# Patient Record
Sex: Male | Born: 1975 | Race: White | Hispanic: No | Marital: Single | Smoking: Former smoker
Health system: Southern US, Community
[De-identification: ages and names within clinical notes are randomized; demographics above are authoritative.]

## PROBLEM LIST (undated history)

## (undated) DIAGNOSIS — I4891 Unspecified atrial fibrillation: Secondary | ICD-10-CM

## (undated) HISTORY — PX: HERNIA REPAIR: SHX51

---

## 2003-06-10 ENCOUNTER — Other Ambulatory Visit: Payer: Self-pay

## 2004-12-10 ENCOUNTER — Emergency Department: Payer: Self-pay | Admitting: Emergency Medicine

## 2004-12-21 ENCOUNTER — Emergency Department: Payer: Self-pay | Admitting: Emergency Medicine

## 2006-04-07 ENCOUNTER — Emergency Department: Payer: Self-pay | Admitting: Emergency Medicine

## 2006-04-10 ENCOUNTER — Emergency Department: Payer: Self-pay | Admitting: Emergency Medicine

## 2013-07-25 LAB — DRUG SCREEN, URINE
Amphetamines, Ur Screen: NEGATIVE (ref ?–1000)
BENZODIAZEPINE, UR SCRN: NEGATIVE (ref ?–200)
Barbiturates, Ur Screen: NEGATIVE (ref ?–200)
Cannabinoid 50 Ng, Ur ~~LOC~~: NEGATIVE (ref ?–50)
Cocaine Metabolite,Ur ~~LOC~~: NEGATIVE (ref ?–300)
MDMA (ECSTASY) UR SCREEN: NEGATIVE (ref ?–500)
Methadone, Ur Screen: NEGATIVE (ref ?–300)
OPIATE, UR SCREEN: NEGATIVE (ref ?–300)
Phencyclidine (PCP) Ur S: NEGATIVE (ref ?–25)
TRICYCLIC, UR SCREEN: NEGATIVE (ref ?–1000)

## 2013-07-25 LAB — CBC
HCT: 48.5 % (ref 40.0–52.0)
HGB: 16.5 g/dL (ref 13.0–18.0)
MCH: 30.5 pg (ref 26.0–34.0)
MCHC: 34 g/dL (ref 32.0–36.0)
MCV: 90 fL (ref 80–100)
PLATELETS: 243 10*3/uL (ref 150–440)
RBC: 5.4 10*6/uL (ref 4.40–5.90)
RDW: 12.5 % (ref 11.5–14.5)
WBC: 6.5 10*3/uL (ref 3.8–10.6)

## 2013-07-25 LAB — BASIC METABOLIC PANEL
Anion Gap: 8 (ref 7–16)
BUN: 14 mg/dL (ref 7–18)
CHLORIDE: 108 mmol/L — AB (ref 98–107)
CO2: 24 mmol/L (ref 21–32)
Calcium, Total: 8.9 mg/dL (ref 8.5–10.1)
Creatinine: 0.94 mg/dL (ref 0.60–1.30)
EGFR (African American): >60
EGFR (Non-African Amer.): >60
GLUCOSE: 108 mg/dL — AB (ref 65–99)
OSMOLALITY: 280 (ref 275–301)
Potassium: 3.3 mmol/L — ABNORMAL LOW (ref 3.5–5.1)
Sodium: 140 mmol/L (ref 136–145)

## 2013-07-25 LAB — TROPONIN I: Troponin-I: <0.02 ng/mL

## 2013-07-26 ENCOUNTER — Observation Stay: Payer: Self-pay | Admitting: Internal Medicine

## 2013-07-26 LAB — CK-MB
CK-MB: 1.6 ng/mL (ref 0.5–3.6)
CK-MB: 1.3 ng/mL (ref 0.5–3.6)
CK-MB: 1.7 ng/mL (ref 0.5–3.6)

## 2013-07-26 LAB — TSH: Thyroid Stimulating Horm: 7.87 u[IU]/mL — ABNORMAL HIGH

## 2013-07-26 LAB — TROPONIN I
Troponin-I: <0.02 ng/mL
Troponin-I: <0.02 ng/mL

## 2013-07-26 LAB — T4, FREE: Free Thyroxine: 1.09 ng/dL (ref 0.76–1.46)

## 2013-07-27 DIAGNOSIS — R000 Tachycardia, unspecified: Secondary | ICD-10-CM

## 2013-07-27 DIAGNOSIS — R079 Chest pain, unspecified: Secondary | ICD-10-CM

## 2013-07-27 LAB — LIPID PANEL
Cholesterol: 173 mg/dL (ref 0–200)
HDL Cholesterol: 44 mg/dL (ref 40–60)
LDL CHOLESTEROL, CALC: 110 mg/dL — AB (ref 0–100)
Triglycerides: 97 mg/dL (ref 0–200)
VLDL Cholesterol, Calc: 19 mg/dL (ref 5–40)

## 2013-07-27 LAB — POTASSIUM: Potassium: 3.8 mmol/L (ref 3.5–5.1)

## 2013-07-27 LAB — MAGNESIUM: Magnesium: 2.2 mg/dL

## 2014-06-01 NOTE — Discharge Summary (Signed)
PATIENT NAME:  Park LiterLOFTIS, Aarron A MR#:  [id number] DATE OF BIRTH:  Dec 02, 1975  DATE OF ADMISSION:  07/26/2013 DATE OF DISCHARGE:  07/27/2013  PRIMARY CARE PHYSICIAN:  Nonlocal.  DISCHARGE DIAGNOSIS:  New paroxysmal atrial fib, hyperlipidemia, hypokalemia.   CONDITION:  Stable.   CODE STATUS:   FULL CODE.   HOME MEDICATIONS:  Aspirin 81 mg p.o. daily.   DIET:  Low-fat.   ACTIVITY:  As tolerated.   FOLLOWUP CARE: Follow up with PCP within 2 to 4 weeks. Follow up with Dr. Lady GaryFath, cardiologist, p.r.n.  The patient was advised for exercise, diet control.   REASON FOR ADMISSION:  Chest fluttering.   HOSPITAL COURSE: The patient is a 39 year old Caucasian male with no past medical history, came to the ED for chest fluttering. The patient was noticed to have Afib with RVR up to 180s, was treated with Cardizem 20 mg IV. In the ED, the patient's heart rate was still in 120s, received more Cardizem and then converted to normal sinus rhythm. For detailed history and physical examination, please refer to the admission note dictated by Dr. Clint GuyHower. Laboratory data on admission date showed potassium 3.3, otherwise normal labs.  EKG performed on arrival reveals Afib with RVR at 122.  After Cardizem treatment, converted to normal sinus rhythm at 91 bpm.  1.  New onset paroxysmal atrial fib. The patient has no symptoms after converting to normal sinus rhythm. Dr. Lady GaryFath evaluated the patient with echocardiogram, which is normal. The patient's CHADS2 score is 0. According to Dr. Lady GaryFath, the patient does not need anticoagulation and also does not need further cardiac workup. The patient may be discharged.  2.  Hypokalemia, replaced with potassium and potassium increased to normal range. 3.  The patient's LDL is 110. He has hyperlipidemia. He was advised exercise and diet control.   The patient has no complaints. Vital signs are stable. The patient will be discharged to home today. I discussed the patient's discharge  plan with the patient, the patient's family member, nurse and case Production designer, theatre/television/filmmanager.   TIME SPENT: About 35 minutes.   ____________________________ Shaune PollackQing Gilbert Narain, MD qc:dmm D: 07/27/2013 16:41:00 ET T: 07/27/2013 22:34:23 ET JOB#: [id number]  cc: Shaune PollackQing Debe Anfinson, MD, <Dictator> Shaune PollackQING Keysean Savino MD ELECTRONICALLY SIGNED 07/28/2013 14:33

## 2014-06-01 NOTE — Consult Note (Signed)
   Present Illness 39 year old male with no prior cardiac or medical problems who presented with palpitations and was noted to be in atrial fibrillation rapid ventricular response.  He received IV Cardizem with conversion to sinus rhythm.  He was relatively hypo Kaylie me.  He currently is in normal sinus rhythm.  The he denies any chest pain or shortness of breath.  He ruled out for a myocardia infarction.  Patient admits to drinking a case of beer on Friday nights.  He does not drink much during the week.  The he denies previous episodes of sustained atrial fibrillation.  His chads score is 0. echocardiogram reveals preserved left ventricular function with no high-grade valvular disease.  Atria do not appear significantly enlarged.   Physical Exam:  GEN well developed, well nourished, no acute distress   HEENT PERRL, moist oral mucosa   NECK supple   RESP normal resp effort  clear BS   CARD Regular rate and rhythm  No murmur   ABD denies tenderness  normal BS   LYMPH negative neck, negative axillae   EXTR negative cyanosis/clubbing, negative edema   SKIN normal to palpation   NEURO cranial nerves intact, motor/sensory function intact   PSYCH A+O to time, place, person   Review of Systems:  Subjective/Chief Complaint Palpitations and rapid heart rate   General: No Complaints   Skin: No Complaints   ENT: No Complaints   Eyes: No Complaints   Neck: No Complaints   Respiratory: No Complaints   Cardiovascular: No Complaints   Gastrointestinal: No Complaints   Genitourinary: No Complaints   Vascular: No Complaints   Musculoskeletal: No Complaints   Neurologic: No Complaints   Hematologic: No Complaints   Endocrine: No Complaints   Psychiatric: No Complaints   Review of Systems: All other systems were reviewed and found to be negative   Medications/Allergies Reviewed Medications/Allergies reviewed   Family & Social History:  Family and Social History:   Family History Non-Contributory   Social History negative tobacco, positive ETOH   Place of Living Home   EKG:  Abnormal NSSTTW changes   Interpretation admission electrocardiogram revealed atrial fibrillation with variable ventricular response.  Currently is converted to sinus rhythm on telemetry    No Known Allergies:    Impression 39 year old male with history of binge ethanol intake to a with with atrial fibrillation with rapid ventricular response.  He is converted to sinus rhythm with Cardizem.  His chads score is 0.  His echo reveals preserved left ventricular function.  He does not appear to be in heart failure or ischemic at present.  He has ruled out for a myocardia infarction.  Would recommend ambulating and discharging on current regimen.  Patient does not need anticoagulation due to a chads score of 0. This is lone atrial fibrillation.  Cessation or reduction dramatically of ethanol intake was discussed with the patient.   Plan 1. Continue with current meds.  Patient does not need anticoagulation or anti arrhythmics at present as this appears to be lone atrial fibrillation likely secondary to relative   low serum  potassium and episodic ethanol intake. 2. Reduce ethanol intake 3. no further cardiac workup indicated.  Okay for discharge from cardiac standpoint   Electronic Signatures: Dalia HeadingFath, Kenneth A (MD)  (Signed 19-Jun-15 08:34)  Authored: General Aspect/Present Illness, History and Physical Exam, Review of System, Family & Social History, EKG , Allergies, Impression/Plan   Last Updated: 19-Jun-15 08:34 by Dalia HeadingFath, Kenneth A (MD)

## 2014-06-01 NOTE — H&P (Signed)
PATIENT NAME:  Todd Michael, Zyree A MR#:  [id number] DATE OF BIRTH:  07/08/1975  DATE OF ADMISSION:  07/25/2013  REFERRING PHYSICIAN: Dr. Bayard Malesandolph Brown.   PRIMARY CARE PHYSICIAN: None.   CHIEF COMPLAINT: Chest fluttering.   HISTORY OF PRESENT ILLNESS: A 39 year old Caucasian gentleman without significant past medical history presenting with palpitations. Describes acute onset of palpitations which he describes as fluttering sensation in his chest, occurring as he was going to sleep. Denies any chest pain, shortness of breath, nausea, vomiting, diaphoresis, fevers, chills or further symptomatology. Denies any preceding illnesses. He was in his usual state of health. On EMS arrival, he was noted to be in atrial fibrillation with rapid ventricular response, heart rate into the 180s. He was given Cardizem 20 mg IV. In the Emergency Department, he remained in atrial fibrillation, heart rate still in the 120s. Received more Cardizem and subsequently converted to normal sinus rhythm. Currently, he is asymptomatic without any complaints.   REVIEW OF SYSTEMS:  CONSTITUTIONAL: Denies fevers, chills, fatigue, weakness.  EYES: Denied blurred vision, double vision, eye pain.  EARS, NOSE, THROAT: Denies tinnitus, ear pain, hearing loss.  RESPIRATORY: Denies cough, wheeze, shortness of breath.  CARDIOVASCULAR: Positive for palpitations as described above. Denies any chest pain or edema.  GASTROINTESTINAL: Denies nausea, vomiting, diarrhea, abdominal pain.  GENITOURINARY: Denies dysuria or hematuria.  ENDOCRINE: Denies nocturia or thyroid problems.  HEMATOLOGIC AND LYMPHATIC: Denies easy bruising or bleeding.  SKIN: Denies rashes or lesions.  MUSCULOSKELETAL: Denies pain in neck, back, shoulder, knees, hips or arthritic symptoms.  NEUROLOGIC: Denies any paralysis, paresthesias.  PSYCHIATRIC: Denies anxiety or depressive symptoms.   Otherwise, full review of systems performed by me is negative.   PAST MEDICAL  HISTORY: None.   SOCIAL HISTORY: Denies any tobacco usage. Positive for occasional alcohol usage, stating drinks a few drinks weekly. Last drink greater than a week ago. Denies any drug use. As far as caffeinated beverages, drinks 4 to 5 cans of soda daily.   FAMILY HISTORY: Positive for coronary artery disease as well as atrial fibrillation in his parents as well as his sister.   ALLERGIES: No known drug allergies.   HOME MEDICATIONS: None.   PHYSICAL EXAMINATION:  VITAL SIGNS: Temperature 98.4, heart rate on arrival 142, currently 78, respirations 18, blood pressure 146/89, saturating 96% on room air. Weight 80.3 kg, BMI 26.2.  GENERAL: Well-nourished, well-developed, Caucasian gentleman, currently in no acute distress.  HEAD: Normocephalic, atraumatic.  EYES: Pupils equal, round and reactive to light. Extraocular muscles intact. No scleral icterus.  MOUTH: Moist mucosal membranes. Dentition intact. No abscess noted.  EARS, NOSE, THROAT: Clear without exudates. No external lesions.  NECK: Supple. No thyromegaly. No nodules. No JVD.  PULMONARY: Clear to auscultation bilaterally without wheezes, rubs or rhonchi. No use of accessory muscles. Good respiratory effort.  CHEST: Nontender to palpation.  CARDIOVASCULAR: S1, S2, regular rate and rhythm. No murmurs, rubs or gallops. No edema. Pedal pulses 2+ bilaterally.  GASTROINTESTINAL: Soft, nontender, nondistended. No masses. Positive bowel sounds. No hepatosplenomegaly.  MUSCULOSKELETAL: No swelling, clubbing or edema. Range of motion full in all extremities.  NEUROLOGIC: Cranial nerves II through XII intact. No gross focal neurological deficits. Sensation intact. Reflexes intact.  SKIN: No ulceration, lesions, rashes or cyanosis. Skin warm, dry. Turgor intact.  PSYCHIATRIC: Mood and affect within normal limits. The patient is awake, alert, oriented x 3. Insight and judgment intact.   LABORATORY DATA: EKG performed on arrival reveals atrial  fibrillation, rapid ventricular response, heart rate  of 122. Repeat EKG reveals normal sinus rhythm, heart rate 91. No ST or T wave abnormalities. Remainder of laboratory data: Sodium 140, potassium 3.3, chloride 108, bicarb 24, BUN 14, creatinine 0.94, glucose 108. Troponin I less than 0.02. Urine drug screen negative. WBC 6.5, hemoglobin 16.5, platelets of 243.   ASSESSMENT AND PLAN: A 39 year old gentleman without significant past medical history presenting with palpitations. Found to be in atrial fibrillation.  1. New onset atrial fibrillation with rapid ventricular response: He is currently in normal sinus rhythm after receiving a total of 40 mg of intravenous Cardizem. Will place on telemetry. Trend cardiac enzymes. Consult cardiology. Can use intravenous Cardizem if required for elevated heart rate sustained greater than 120. Will check a TSH and transthoracic echocardiogram in search for etiologies. His CHADS score is 0, meaning for anticoagulation he should just receive aspirin.  2. Hypokalemia: Replace potassium to goal of 4 to 5.  3. Venous thromboembolism prophylaxis with heparin subcutaneous.   The patient is FULL CODE.   TIME SPENT: 45 minutes.    ____________________________ Cletis Athens. Hower, MD dkh:gb D: 07/26/2013 01:23:19 ET T: 07/26/2013 01:43:27 ET JOB#: [id number]  cc: Cletis Athens. Hower, MD, <Dictator> DAVID Synetta Shadow MD ELECTRONICALLY SIGNED 07/26/2013 20:26

## 2014-06-01 NOTE — Consult Note (Signed)
Brief Consult Note: Diagnosis: afib with rvr.   Patient was seen by consultant.   Comments: 39 yo male with history who presented to the er with complaints of palpitaitons. He was noted to e in afib with rvr. He was given iv cardizem and converted to nsr with iv cardizem. He has ruled out for an mi. Echo showed normal lv funciton. I have discussed importance of reducing etoh intake. Would ambulate and consider discharge . Not candidate for anticoagulation with lone afib. CHADSS score is 0. Will follow as outpatient if desired. I will discontinue sq heparin. No further cardiac workup indicatd as inpatient.  Electronic Signatures: Dalia HeadingFath, Kenneth A (MD)  (Signed 18-Jun-15 18:12)  Authored: Brief Consult Note   Last Updated: 18-Jun-15 18:12 by Dalia HeadingFath, Kenneth A (MD)

## 2014-10-03 ENCOUNTER — Ambulatory Visit: Payer: Self-pay | Admitting: Family Medicine

## 2017-11-19 ENCOUNTER — Emergency Department
Admission: EM | Admit: 2017-11-19 | Discharge: 2017-11-19 | Disposition: A | Discharge status: Home / Self Care | Payer: Non-veteran care | Attending: Emergency Medicine | Admitting: Emergency Medicine

## 2017-11-19 ENCOUNTER — Emergency Department: Payer: Non-veteran care

## 2017-11-19 ENCOUNTER — Encounter: Payer: Self-pay | Admitting: Emergency Medicine

## 2017-11-19 ENCOUNTER — Other Ambulatory Visit: Payer: Self-pay

## 2017-11-19 DIAGNOSIS — K224 Dyskinesia of esophagus: Secondary | ICD-10-CM | POA: Insufficient documentation

## 2017-11-19 DIAGNOSIS — M62838 Other muscle spasm: Secondary | ICD-10-CM | POA: Diagnosis present

## 2017-11-19 DIAGNOSIS — Z87891 Personal history of nicotine dependence: Secondary | ICD-10-CM | POA: Diagnosis not present

## 2017-11-19 HISTORY — DX: Unspecified atrial fibrillation: I48.91

## 2017-11-19 LAB — COMPREHENSIVE METABOLIC PANEL
ALBUMIN: 4.5 g/dL (ref 3.5–5.0)
ALT: 34 U/L (ref 0–44)
AST: 24 U/L (ref 15–41)
Alkaline Phosphatase: 78 U/L (ref 38–126)
Anion gap: 10 (ref 5–15)
BILIRUBIN TOTAL: 0.6 mg/dL (ref 0.3–1.2)
BUN: 13 mg/dL (ref 6–20)
CALCIUM: 8.5 mg/dL — AB (ref 8.9–10.3)
CO2: 21 mmol/L — ABNORMAL LOW (ref 22–32)
CREATININE: 0.85 mg/dL (ref 0.61–1.24)
Chloride: 108 mmol/L (ref 98–111)
GFR calc Af Amer: >60 mL/min (ref >60)
GFR calc non Af Amer: >60 mL/min (ref >60)
GLUCOSE: 99 mg/dL (ref 70–99)
Potassium: 3.9 mmol/L (ref 3.5–5.1)
Sodium: 139 mmol/L (ref 135–145)
TOTAL PROTEIN: 7.1 g/dL (ref 6.5–8.1)

## 2017-11-19 LAB — CBC
HEMATOCRIT: 47.4 % (ref 39.0–52.0)
Hemoglobin: 16.1 g/dL (ref 13.0–17.0)
MCH: 30.1 pg (ref 26.0–34.0)
MCHC: 34 g/dL (ref 30.0–36.0)
MCV: 88.6 fL (ref 80.0–100.0)
Platelets: 272 10*3/uL (ref 150–400)
RBC: 5.35 MIL/uL (ref 4.22–5.81)
RDW: 11.9 % (ref 11.5–15.5)
WBC: 8.5 10*3/uL (ref 4.0–10.5)
nRBC: 0 % (ref 0.0–0.2)

## 2017-11-19 LAB — TROPONIN I
Troponin I: <0.03 ng/mL (ref <0.03)
Troponin I: <0.03 ng/mL (ref <0.03)

## 2017-11-19 MED ORDER — HYOSCYAMINE SULFATE 0.125 MG PO TABS: 0.1250 mg | ORAL_TABLET | ORAL | 0 refills | Status: AC | PRN

## 2017-11-19 MED ORDER — GI COCKTAIL ~~LOC~~
30.0000 mL | Freq: Once | ORAL | Status: AC
  Administered 2017-11-19: 30 mL via ORAL
  Filled 2017-11-19: qty 30

## 2017-11-19 NOTE — ED Triage Notes (Signed)
Chest pain began 11am today.

## 2017-11-19 NOTE — ED Provider Notes (Signed)
[facility] Emergency Department Provider Note   ____________________________________________    I have reviewed the triage vital signs and the nursing notes.   HISTORY  Chief Complaint Chest Pain     HPI Todd Michael is a 42 y.o. male who presents with complaints of chest spasms.  Patient reports that approximately 11 AM he developed pain in his chest which is described as a spasm-like pain.  He reports this started after eating homemade soup.  Patient does report a history of esophageal spasms for which he has been trialed on hyoscyamine which he reports worked quite well but that he no longer has.  He denies diaphoresis.  No shortness of breath.  No pleurisy.  No calf pain or swelling.  No fevers chills or cough.   Past Medical History:  Diagnosis Date  . Atrial fibrillation (HCC)     There are no active problems to display for this patient.   Past Surgical History:  Procedure Laterality Date  . HERNIA REPAIR      Prior to Admission medications   Medication Sig Start Date End Date Taking? Authorizing Provider  hyoscyamine (LEVSIN, ANASPAZ) 0.125 MG tablet Take 1 tablet (0.125 mg total) by mouth every 4 (four) hours as needed. 11/19/17   Jene Every, MD     Allergies Patient has no known allergies.  No family history on file.  Social History Social History   Tobacco Use  . Smoking status: Former Smoker  Substance Use Topics  . Alcohol use: Not on file  . Drug use: Not on file    Review of Systems  Constitutional: No fever/chills Eyes: No visual changes.  ENT: No sore throat. Cardiovascular: As above Respiratory: Denies shortness of breath. Gastrointestinal: No abdominal pain.   Genitourinary: Negative for dysuria. Musculoskeletal: Negative for back pain. Skin: Negative for rash. Neurological: Negative for headaches or weakness   ____________________________________________   PHYSICAL EXAM:  VITAL SIGNS: ED  Triage Vitals  Enc Vitals Group     BP 11/19/17 1505 (!) 146/66     Pulse Rate 11/19/17 1505 (!) 110     Resp 11/19/17 1505 20     Temp 11/19/17 1505 98.4 F (36.9 C)     Temp Source 11/19/17 1505 Oral     SpO2 11/19/17 1505 94 %     Weight 11/19/17 1506 87.5 kg (193 lb)     Height 11/19/17 1506 1.753 m (5\' 9" )     Head Circumference --      Peak Flow --      Pain Score 11/19/17 1505 1     Pain Loc --      Pain Edu? --      Excl. in GC? --     Constitutional: Alert and oriented.  Eyes: Conjunctivae are normal.   Nose: No congestion/rhinnorhea. Mouth/Throat: Mucous membranes are moist.    Cardiovascular: Normal rate, regular rhythm. Grossly normal heart sounds.  Good peripheral circulation. Respiratory: Normal respiratory effort.  No retractions. Lungs CTAB. Gastrointestinal: Soft and nontender. No distention.  No CVA tenderness. Genitourinary: deferred Musculoskeletal: No lower extremity tenderness nor edema.  Warm and well perfused Neurologic:  Normal speech and language. No gross focal neurologic deficits are appreciated.  Skin:  Skin is warm, dry and intact. No rash noted. Psychiatric: Mood and affect are normal. Speech and behavior are normal.  ____________________________________________   LABS (all labs ordered are listed, but only abnormal results are displayed)  Labs Reviewed  COMPREHENSIVE METABOLIC  PANEL - Abnormal; Notable for the following components:      Result Value   CO2 21 (*)    Calcium 8.5 (*)    All other components within normal limits  CBC  TROPONIN I  TROPONIN I   ____________________________________________  EKG  ED ECG REPORT I, Jene Every, the attending physician, personally viewed and interpreted this ECG.  Date: 11/19/2017  Rhythm: normal sinus rhythm QRS Axis: normal Intervals: normal ST/T Wave abnormalities: normal Narrative Interpretation: no evidence of acute  ischemia  ____________________________________________  RADIOLOGY  Chest x-ray unremarkable ____________________________________________   PROCEDURES  Procedure(s) performed: No  Procedures   Critical Care performed: No ____________________________________________   INITIAL IMPRESSION / ASSESSMENT AND PLAN / ED COURSE  Pertinent labs & imaging results that were available during my care of the patient were reviewed by me and considered in my medical decision making (see chart for details).  Patient overall well-appearing, he feels this is related to esophageal spasm and is very possible.  Troponin negative x2, EKG overall reassuring.  Chest x-ray normal.  Otherwise lab work is unremarkable.  Patient treated with GI cocktail and had instant resolution of symptoms furthering the hypothesis that this is related to esophageal spasm.  Given overall normal and reassuring work-up will discharge with hyoscyamine, close follow-up with PCP.  Strict return precautions discussed    ____________________________________________   FINAL CLINICAL IMPRESSION(S) / ED DIAGNOSES  Final diagnoses:  Esophageal spasm        Note:  This document was prepared using Dragon voice recognition software and may include unintentional dictation errors.    Jene Every, MD 11/19/17 2228

## 2019-07-01 ENCOUNTER — Other Ambulatory Visit: Payer: Self-pay

## 2019-07-01 ENCOUNTER — Observation Stay
Admission: EM | Admit: 2019-07-01 | Discharge: 2019-07-02 | Disposition: A | Discharge status: Home / Self Care | Payer: No Typology Code available for payment source | Attending: Internal Medicine | Admitting: Internal Medicine

## 2019-07-01 DIAGNOSIS — R778 Other specified abnormalities of plasma proteins: Secondary | ICD-10-CM

## 2019-07-01 DIAGNOSIS — R7989 Other specified abnormal findings of blood chemistry: Secondary | ICD-10-CM | POA: Insufficient documentation

## 2019-07-01 DIAGNOSIS — Z87891 Personal history of nicotine dependence: Secondary | ICD-10-CM | POA: Insufficient documentation

## 2019-07-01 DIAGNOSIS — I248 Other forms of acute ischemic heart disease: Secondary | ICD-10-CM

## 2019-07-01 DIAGNOSIS — E876 Hypokalemia: Secondary | ICD-10-CM | POA: Insufficient documentation

## 2019-07-01 DIAGNOSIS — Z20822 Contact with and (suspected) exposure to covid-19: Secondary | ICD-10-CM | POA: Insufficient documentation

## 2019-07-01 DIAGNOSIS — R000 Tachycardia, unspecified: Secondary | ICD-10-CM | POA: Diagnosis present

## 2019-07-01 DIAGNOSIS — I480 Paroxysmal atrial fibrillation: Principal | ICD-10-CM | POA: Insufficient documentation

## 2019-07-01 DIAGNOSIS — Z79899 Other long term (current) drug therapy: Secondary | ICD-10-CM | POA: Insufficient documentation

## 2019-07-01 DIAGNOSIS — F1010 Alcohol abuse, uncomplicated: Secondary | ICD-10-CM | POA: Insufficient documentation

## 2019-07-01 DIAGNOSIS — N289 Disorder of kidney and ureter, unspecified: Secondary | ICD-10-CM | POA: Insufficient documentation

## 2019-07-01 DIAGNOSIS — I4891 Unspecified atrial fibrillation: Secondary | ICD-10-CM

## 2019-07-01 LAB — CBC WITH DIFFERENTIAL/PLATELET
Abs Immature Granulocytes: 0.01 10*3/uL (ref 0.00–0.07)
Basophils Absolute: 0.1 10*3/uL (ref 0.0–0.1)
Basophils Relative: 1 %
Eosinophils Absolute: 0.5 10*3/uL (ref 0.0–0.5)
Eosinophils Relative: 8 %
HCT: 44.5 % (ref 39.0–52.0)
Hemoglobin: 15.7 g/dL (ref 13.0–17.0)
Immature Granulocytes: 0 %
Lymphocytes Relative: 27 %
Lymphs Abs: 1.6 10*3/uL (ref 0.7–4.0)
MCH: 30.9 pg (ref 26.0–34.0)
MCHC: 35.3 g/dL (ref 30.0–36.0)
MCV: 87.6 fL (ref 80.0–100.0)
Monocytes Absolute: 0.6 10*3/uL (ref 0.1–1.0)
Monocytes Relative: 10 %
Neutro Abs: 3.2 10*3/uL (ref 1.7–7.7)
Neutrophils Relative %: 54 %
Platelets: 238 10*3/uL (ref 150–400)
RBC: 5.08 MIL/uL (ref 4.22–5.81)
RDW: 12 % (ref 11.5–15.5)
WBC: 5.9 10*3/uL (ref 4.0–10.5)
nRBC: 0 % (ref 0.0–0.2)

## 2019-07-01 LAB — PROTIME-INR
INR: 1 (ref 0.8–1.2)
Prothrombin Time: 12.6 seconds (ref 11.4–15.2)

## 2019-07-01 MED ORDER — DILTIAZEM HCL 25 MG/5ML IV SOLN
20.0000 mg | Freq: Once | INTRAVENOUS | Status: AC
  Administered 2019-07-01: 20 mg via INTRAVENOUS
  Filled 2019-07-01: qty 5

## 2019-07-01 MED ORDER — SODIUM CHLORIDE 0.9 % IV BOLUS
1000.0000 mL | Freq: Once | INTRAVENOUS | Status: AC
  Administered 2019-07-01: 1000 mL via INTRAVENOUS

## 2019-07-01 MED ORDER — DILTIAZEM HCL ER COATED BEADS 180 MG PO CP24: 180.0000 mg | ORAL_CAPSULE | Freq: Once | ORAL | Status: DC

## 2019-07-01 MED ORDER — MAGNESIUM SULFATE 2 GM/50ML IV SOLN
2.0000 g | Freq: Once | INTRAVENOUS | Status: AC
  Administered 2019-07-01: 2 g via INTRAVENOUS
  Filled 2019-07-01: qty 50

## 2019-07-01 NOTE — ED Triage Notes (Signed)
Patient to the ED from home via EMS for a fib.  EMS and patient reports history of a fib that he will usually cough hard and returns to normal rhythm.  On EMS arrival heart rate 140's to 170's.  Given Metroprol 5mg  IV.  Patient with IV to left forearm via 20g angiocath.

## 2019-07-01 NOTE — ED Provider Notes (Signed)
[facility] Emergency Department Provider Note  ____________________________________________  Time seen: Approximately 11:35 PM  I have reviewed the triage vital signs and the nursing notes.   HISTORY  Chief Complaint Tachycardia    HPI Todd Michael is a 44 y.o. male with a past history of atrial fibrillation who comes the ED complaining of palpitations and racing heart, concerned that he is having A. fib again.  He states that he tends to binge drink on Fridays and Saturdays, last drinking was 2 days ago and was heavy.  Does not take any medications.  Onset of palpitations tonight was at bedtime, just 30 minutes prior to arrival, constant, no aggravating or alleviating factors.  He states that sometimes he gets palpitations and can cause it to go away with forceful coughing but not tonight.  Denies smoking or drug use.  No excessive caffeine or other stimulants.  Reviewed electronic medical record from 2015 when he had initial onset of atrial fibrillation, thought to be a lone episode due to alcohol abuse.      Past Medical History:  Diagnosis Date  . Atrial fibrillation ([place])      There are no problems to display for this patient.    Past Surgical History:  Procedure Laterality Date  . HERNIA REPAIR       Prior to Admission medications   Medication Sig Start Date End Date Taking? Authorizing Provider  hyoscyamine (LEVSIN, ANASPAZ) 0.125 MG tablet Take 1 tablet (0.125 mg total) by mouth every 4 (four) hours as needed. 11/19/17   Lavonia Drafts, MD     Allergies Patient has no known allergies.   No family history on file.  Social History Social History   Tobacco Use  . Smoking status: Former Smoker  Substance Use Topics  . Alcohol use: Not on file  . Drug use: Not on file    Review of Systems  Constitutional:   No fever or chills.  ENT:   No sore throat. No rhinorrhea. Cardiovascular:   No chest pain or syncope.   Positive palpitations Respiratory:   No dyspnea or cough. Gastrointestinal:   Negative for abdominal pain, vomiting and diarrhea.  Musculoskeletal:   Negative for focal pain or swelling All other systems reviewed and are negative except as documented above in ROS and HPI.  ____________________________________________   PHYSICAL EXAM:  VITAL SIGNS: ED Triage Vitals  Enc Vitals Group     BP 07/01/19 2218 (!) 156/119     Pulse Rate 07/01/19 2218 (!) 111     Resp 07/01/19 2218 19     Temp 07/01/19 2218 98.2 F (36.8 C)     Temp Source 07/01/19 2218 Oral     SpO2 07/01/19 2218 100 %     Weight 07/01/19 2231 185 lb (83.9 kg)     Height 07/01/19 2231 5\' 9"  (1.753 m)     Head Circumference --      Peak Flow --      Pain Score 07/01/19 2230 0     Pain Loc --      Pain Edu? --      Excl. in [place]? --     Vital signs reviewed, nursing assessments reviewed.   Constitutional:   Alert and oriented. Non-toxic appearance. Eyes:   Conjunctivae are normal. EOMI. PERRL. ENT      Head:   Normocephalic and atraumatic.      Nose:   Wearing a mask.      Mouth/Throat:  Wearing a mask.      Neck:   No meningismus. Full ROM. Hematological/Lymphatic/Immunilogical:   No cervical lymphadenopathy. Cardiovascular:   Irregularly irregular rhythm, tachycardia with a heart rate of 140. Symmetric bilateral radial and DP pulses.  No murmurs. Cap refill less than 2 seconds. Respiratory:   Normal respiratory effort without tachypnea/retractions. Breath sounds are clear and equal bilaterally. No wheezes/rales/rhonchi. Gastrointestinal:   Soft and nontender. Non distended. There is no CVA tenderness.  No rebound, rigidity, or guarding.  Musculoskeletal:   Normal range of motion in all extremities. No joint effusions.  No lower extremity tenderness.  No edema. Neurologic:   Normal speech and language.  Motor grossly intact. No acute focal neurologic deficits are appreciated.  Skin:    Skin is warm, dry and  intact. No rash noted.  No petechiae, purpura, or bullae.  ____________________________________________    LABS (pertinent positives/negatives) (all labs ordered are listed, but only abnormal results are displayed) Labs Reviewed  SARS CORONAVIRUS 2 BY RT PCR (HOSPITAL ORDER, PERFORMED IN [place])  CBC WITH DIFFERENTIAL/PLATELET  PROTIME-INR  COMPREHENSIVE METABOLIC PANEL  TSH  T4, FREE  MAGNESIUM  ETHANOL  TROPONIN I (HIGH SENSITIVITY)   ____________________________________________   EKG  Interpreted by me Atrial fibrillation, rate of 144.  Normal axis and intervals.  Normal QRS ST segments and T waves.  ____________________________________________    RADIOLOGY  No results found.  ____________________________________________   PROCEDURES .Critical Care Performed by: Sharman Cheek, MD Authorized by: Sharman Cheek, MD   Critical care provider statement:    Critical care time (minutes):  32   Critical care time was exclusive of:  Separately billable procedures and treating other patients   Critical care was necessary to treat or prevent imminent or life-threatening deterioration of the following conditions:  Circulatory failure and cardiac failure   Critical care was time spent personally by me on the following activities:  Development of treatment plan with patient or surrogate, discussions with consultants, evaluation of patient's response to treatment, examination of patient, obtaining history from patient or surrogate, ordering and performing treatments and interventions, ordering and review of laboratory studies, ordering and review of radiographic studies, pulse oximetry, re-evaluation of patient's condition and review of old charts    ____________________________________________  DIFFERENTIAL DIAGNOSIS   Cardiomyopathy, electrolyte abnormality, hyperthyroidism, dehydration, holiday heart  CLINICAL IMPRESSION / ASSESSMENT AND PLAN /  ED COURSE  Medications ordered in the ED: Medications  sodium chloride 0.9 % bolus 1,000 mL (1,000 mLs Intravenous New Bag/Given 07/01/19 2252)  diltiazem (CARDIZEM) injection 20 mg (20 mg Intravenous Given 07/01/19 2252)    Pertinent labs & imaging results that were available during my care of the patient were reviewed by me and considered in my medical decision making (see chart for details).  Tyrel A Figgs was evaluated in Emergency Department on 07/01/2019 for the symptoms described in the history of present illness. He was evaluated in the context of the global COVID-19 pandemic, which necessitated consideration that the patient might be at risk for infection with the SARS-CoV-2 virus that causes COVID-19. Institutional protocols and algorithms that pertain to the evaluation of patients at risk for COVID-19 are in a state of rapid change based on information released by regulatory bodies including the CDC and federal and state organizations. These policies and algorithms were followed during the patient's care in the ED.   Patient presents with essentially new onset of atrial fibrillation with RVR.  Will hydrate with 1 L saline  bolus, give diltiazem bolus.  If lab work-up is reassuring and he is able to convert back to sinus rhythm, could be discharged home.  However, if there are worrisome findings on labs or he remains in A. fib, he will likely require diltiazem infusion and hospitalization for cardiology evaluation.      ____________________________________________   FINAL CLINICAL IMPRESSION(S) / ED DIAGNOSES    Final diagnoses:  Atrial fibrillation with RVR [facility])     ED Discharge Orders    None      Portions of this note were generated with dragon dictation software. Dictation errors may occur despite best attempts at proofreading.   Sharman Cheek, MD 07/01/19 2342

## 2019-07-02 DIAGNOSIS — I4891 Unspecified atrial fibrillation: Secondary | ICD-10-CM | POA: Diagnosis not present

## 2019-07-02 DIAGNOSIS — F1010 Alcohol abuse, uncomplicated: Secondary | ICD-10-CM

## 2019-07-02 DIAGNOSIS — R778 Other specified abnormalities of plasma proteins: Secondary | ICD-10-CM

## 2019-07-02 LAB — COMPREHENSIVE METABOLIC PANEL
ALT: 22 U/L (ref 0–44)
AST: 20 U/L (ref 15–41)
Albumin: 4.1 g/dL (ref 3.5–5.0)
Alkaline Phosphatase: 58 U/L (ref 38–126)
Anion gap: 6 (ref 5–15)
BUN: 11 mg/dL (ref 6–20)
CO2: 23 mmol/L (ref 22–32)
Calcium: 7.9 mg/dL — ABNORMAL LOW (ref 8.9–10.3)
Chloride: 114 mmol/L — ABNORMAL HIGH (ref 98–111)
Creatinine, Ser: 1.27 mg/dL — ABNORMAL HIGH (ref 0.61–1.24)
GFR calc Af Amer: >60 mL/min (ref >60)
GFR calc non Af Amer: >60 mL/min (ref >60)
Glucose, Bld: 122 mg/dL — ABNORMAL HIGH (ref 70–99)
Potassium: 3.3 mmol/L — ABNORMAL LOW (ref 3.5–5.1)
Sodium: 143 mmol/L (ref 135–145)
Total Bilirubin: 0.6 mg/dL (ref 0.3–1.2)
Total Protein: 6.3 g/dL — ABNORMAL LOW (ref 6.5–8.1)

## 2019-07-02 LAB — CBC
HCT: 47.5 % (ref 39.0–52.0)
Hemoglobin: 16.3 g/dL (ref 13.0–17.0)
MCH: 30 pg (ref 26.0–34.0)
MCHC: 34.3 g/dL (ref 30.0–36.0)
MCV: 87.5 fL (ref 80.0–100.0)
Platelets: 253 10*3/uL (ref 150–400)
RBC: 5.43 MIL/uL (ref 4.22–5.81)
RDW: 12.4 % (ref 11.5–15.5)
WBC: 6.2 10*3/uL (ref 4.0–10.5)
nRBC: 0 % (ref 0.0–0.2)

## 2019-07-02 LAB — TROPONIN I (HIGH SENSITIVITY)
Troponin I (High Sensitivity): 48 ng/L — ABNORMAL HIGH (ref <18)
Troponin I (High Sensitivity): 59 ng/L — ABNORMAL HIGH (ref <18)

## 2019-07-02 LAB — MAGNESIUM
Magnesium: 2 mg/dL (ref 1.7–2.4)
Magnesium: 2.5 mg/dL — ABNORMAL HIGH (ref 1.7–2.4)

## 2019-07-02 LAB — PHOSPHORUS: Phosphorus: 2.1 mg/dL — ABNORMAL LOW (ref 2.5–4.6)

## 2019-07-02 LAB — TSH: TSH: 7.08 u[IU]/mL — ABNORMAL HIGH (ref 0.350–4.500)

## 2019-07-02 LAB — T4, FREE: Free T4: 0.81 ng/dL (ref 0.61–1.12)

## 2019-07-02 LAB — HIV ANTIBODY (ROUTINE TESTING W REFLEX): HIV Screen 4th Generation wRfx: NONREACTIVE

## 2019-07-02 LAB — SARS CORONAVIRUS 2 BY RT PCR (HOSPITAL ORDER, PERFORMED IN ~~LOC~~ HOSPITAL LAB): SARS Coronavirus 2: NEGATIVE

## 2019-07-02 MED ORDER — LORAZEPAM 1 MG PO TABS: 1.0000 mg | ORAL_TABLET | ORAL | Status: DC | PRN

## 2019-07-02 MED ORDER — THIAMINE HCL 100 MG PO TABS
100.0000 mg | ORAL_TABLET | Freq: Every day | ORAL | Status: DC
  Administered 2019-07-02: 100 mg via ORAL
  Filled 2019-07-02: qty 1

## 2019-07-02 MED ORDER — ACETAMINOPHEN 325 MG PO TABS: 650.0000 mg | ORAL_TABLET | ORAL | Status: DC | PRN

## 2019-07-02 MED ORDER — ONDANSETRON HCL 4 MG/2ML IJ SOLN: 4.0000 mg | Freq: Four times a day (QID) | INTRAMUSCULAR | Status: DC | PRN

## 2019-07-02 MED ORDER — METOPROLOL TARTRATE 25 MG PO TABS
25.0000 mg | ORAL_TABLET | Freq: Every day | ORAL | 0 refills | Status: AC | PRN
Start: 2019-07-02 — End: 2019-08-01

## 2019-07-02 MED ORDER — LORAZEPAM 2 MG/ML IJ SOLN: 1.0000 mg | INTRAMUSCULAR | Status: DC | PRN

## 2019-07-02 MED ORDER — APIXABAN 5 MG PO TABS
5.0000 mg | ORAL_TABLET | Freq: Once | ORAL | Status: AC
  Administered 2019-07-02: 5 mg via ORAL
  Filled 2019-07-02: qty 1

## 2019-07-02 MED ORDER — ASPIRIN EC 81 MG PO TBEC
81.0000 mg | DELAYED_RELEASE_TABLET | Freq: Every day | ORAL | Status: DC
  Administered 2019-07-02: 81 mg via ORAL
  Filled 2019-07-02: qty 1

## 2019-07-02 MED ORDER — ADULT MULTIVITAMIN W/MINERALS CH
1.0000 | ORAL_TABLET | Freq: Every day | ORAL | Status: DC
  Administered 2019-07-02: 1 via ORAL
  Filled 2019-07-02: qty 1

## 2019-07-02 MED ORDER — POTASSIUM CHLORIDE CRYS ER 20 MEQ PO TBCR
40.0000 meq | EXTENDED_RELEASE_TABLET | Freq: Once | ORAL | Status: AC
  Administered 2019-07-02: 40 meq via ORAL
  Filled 2019-07-02: qty 2

## 2019-07-02 MED ORDER — THIAMINE HCL 100 MG/ML IJ SOLN: 100.0000 mg | Freq: Every day | INTRAMUSCULAR | Status: DC

## 2019-07-02 MED ORDER — ENOXAPARIN SODIUM 40 MG/0.4ML ~~LOC~~ SOLN: 40.0000 mg | SUBCUTANEOUS | Status: DC

## 2019-07-02 MED ORDER — ASPIRIN 81 MG PO TBEC: 81.0000 mg | DELAYED_RELEASE_TABLET | Freq: Every day | ORAL | Status: AC

## 2019-07-02 MED ORDER — FOLIC ACID 1 MG PO TABS
1.0000 mg | ORAL_TABLET | Freq: Every day | ORAL | Status: DC
  Administered 2019-07-02: 1 mg via ORAL
  Filled 2019-07-02: qty 1

## 2019-07-02 MED ORDER — SODIUM CHLORIDE 0.9 % IV SOLN: INTRAVENOUS | Status: DC

## 2019-07-02 NOTE — Progress Notes (Signed)
Todd Michael to be D/C'd Home per MD order.  Discussed prescriptions and follow up appointments with the patient. Prescriptions given to patient, medication list explained in detail. Pt verbalized understanding.  Allergies as of 07/02/2019   No Known Allergies     Medication List    TAKE these medications   aspirin 81 MG EC tablet Take 1 tablet (81 mg total) by mouth daily. Start taking on: Jul 03, 2019   hyoscyamine 0.125 MG tablet Commonly known as: LEVSIN Take 1 tablet (0.125 mg total) by mouth every 4 (four) hours as needed.   metoprolol tartrate 25 MG tablet Commonly known as: LOPRESSOR Take 1 tablet (25 mg total) by mouth daily as needed (palpitations).       Vitals:   07/02/19 0900 07/02/19 0936  BP: 132/86 (!) 156/93  Pulse: 61 (!) 57  Resp: 17 18  Temp:  98 F (36.7 C)  SpO2: 100% 100%    Tele box removed and returned. Skin clean, dry and intact without evidence of skin break down, no evidence of skin tears noted. IV catheter discontinued intact. Site without signs and symptoms of complications. Dressing and pressure applied. Pt denies pain at this time. No complaints noted.  An After Visit Summary was printed and given to the patient. Patient escorted via WC, and D/C home via private auto.  Rigoberto Noel

## 2019-07-02 NOTE — Consult Note (Signed)
CARDIOLOGY CONSULT NOTE               Patient ID: Todd Michael MRN: [id number] DOB/AGE: 44/04/1975 44 y.o.  Admit date: 07/01/2019 Referring Physician Dr. Judd Gaudier Hospitalist [facility] Primary Cardiologist none Reason for Consultation new onset paroxysmal atrial fibrillation rapid ventricular response  HPI: Patient is a 44 year old white male presents with rapid atrial fibrillation rates of around 140 he was originally diagnosed in 44 lone episode related to binge drinking patient was discharged on aspirin which he stopped quite some time ago he occasionally has palpitations and he uses Valsalva to help break the episodes.  This time a Valsalva did not work 100 palpitations lasted over 2 hours so he finally came into the emergency room for evaluation was found to have rapid atrial fibrillation rate of 140 he was treated with diltiazem bolus which broke A. fib converted back to sinus rhythm he was found to be hypokalemic and also slightly elevated creatinine troponins have remained flat he denied any chest pain feels much better.  Gives a history of excessive drinking over the weekend of at least the case consumed in 1 day this is similar to his previous episode in 2015  Review of systems complete and found to be negative unless listed above     Past Medical History:  Diagnosis Date  . Atrial fibrillation [facility])     Past Surgical History:  Procedure Laterality Date  . HERNIA REPAIR      Medications Prior to Admission  Medication Sig Dispense Refill Last Dose  . hyoscyamine (LEVSIN, ANASPAZ) 0.125 MG tablet Take 1 tablet (0.125 mg total) by mouth every 4 (four) hours as needed. (Patient not taking: Reported on 07/02/2019) 30 tablet 0 Not Taking at Unknown time   Social History   Socioeconomic History  . Marital status: Single    Spouse name: Not on file  . Number of children: Not on file  . Years of education: Not on file  . Highest  education level: Not on file  Occupational History  . Not on file  Tobacco Use  . Smoking status: Former Smoker  Substance and Sexual Activity  . Alcohol use: Not on file  . Drug use: Not on file  . Sexual activity: Not on file  Other Topics Concern  . Not on file  Social History Narrative  . Not on file   Social Determinants of Health   Financial Resource Strain:   . Difficulty of Paying Living Expenses:   Food Insecurity:   . Worried About Charity fundraiser in the Last Year:   . Arboriculturist in the Last Year:   Transportation Needs:   . Film/video editor (Medical):   Marland Kitchen Lack of Transportation (Non-Medical):   Physical Activity:   . Days of Exercise per Week:   . Minutes of Exercise per Session:   Stress:   . Feeling of Stress :   Social Connections:   . Frequency of Communication with Friends and Family:   . Frequency of Social Gatherings with Friends and Family:   . Attends Religious Services:   . Active Member of Clubs or Organizations:   . Attends Archivist Meetings:   Marland Kitchen Marital Status:   Intimate Partner Violence:   . Fear of Current or Ex-Partner:   . Emotionally Abused:   Marland Kitchen Physically Abused:   . Sexually Abused:     No family history on file.  Review of systems complete and found to be negative unless listed above      PHYSICAL EXAM  General: Well developed, well nourished, in no acute distress HEENT:  Normocephalic and atramatic Neck:  No JVD.  Lungs: Clear bilaterally to auscultation and percussion. Heart: HRRR . Normal S1 and S2 without gallops or murmurs.  Abdomen: Bowel sounds are positive, abdomen soft and non-tender  Msk:  Back normal, normal gait. Normal strength and tone for age. Extremities: No clubbing, cyanosis or edema.   Neuro: Alert and oriented X 3. Psych:  Good affect, responds appropriately  Labs:   Lab Results  Component Value Date   WBC 6.2 07/02/2019   HGB 16.3 07/02/2019   HCT 47.5 07/02/2019    MCV 87.5 07/02/2019   PLT 253 07/02/2019    Recent Labs  Lab 07/01/19 2226  NA 143  K 3.3*  CL 114*  CO2 23  BUN 11  CREATININE 1.27*  CALCIUM 7.9*  PROT 6.3*  BILITOT 0.6  ALKPHOS 58  ALT 22  AST 20  GLUCOSE 122*   Lab Results  Component Value Date   CKMB 1.7 07/26/2013   TROPONINI <0.03 11/19/2017    Lab Results  Component Value Date   CHOL 173 07/27/2013   Lab Results  Component Value Date   HDL 44 07/27/2013   Lab Results  Component Value Date   LDLCALC 110 (H) 07/27/2013   Lab Results  Component Value Date   TRIG 97 07/27/2013   No results found for: CHOLHDL No results found for: LDLDIRECT    Radiology: No results found.  EKG 1 : Initially rapid atrial fibrillation nonspecific ST-T wave changes at 140 Ekg 2 patient converted to normal sinus rhythm at a rate of about 70 nonspecific finding  ASSESSMENT AND PLAN:  Paroxysmal rapid atrial fibrillation EtOH abuse Renal insufficiency stage II Hypokalemia   Plan Agree with admit to telemetry for observation Patient has since converted to sinus rhythm to recommend aspirin therapy Do not recommend long-term anticoagulation because of low CHADS2 score Advised patient to refrain from excessive alcohol consumption as this may be contributing to the A. Fib Correct electrolytes especially hypokalemia evaluate for hypomagnesemia Recommend adequate hydration for early renal insufficiency Have the patient follow-up with primary physician return to cardiology as necessary Do not recommend any further cardiac work-up at this point advised patient to refrain from significant alcohol abuse Patient is okay to be discharged home on aspirin 81 mg Will consider prescribing pill in a pocket metoprolol 25 mg tartrate as needed for palpitations   Signed: Alwyn Pea MD, 07/02/2019, 9:59 AM

## 2019-07-02 NOTE — ED Notes (Signed)
Admitting MD in to see patient. °

## 2019-07-02 NOTE — ED Provider Notes (Signed)
_________________________ 1:38 AM on 07/02/2019 -----------------------------------------  Accepted care of this patient from Dr. Scotty Court at 11:30 PM.  Patient arrived in A. fib with RVR.  After receiving fluids and a dose of IV Cardizem he converted back to normal sinus rhythm.  His EKG shows no signs of ischemia.  However his initial troponin was positive and trending up with a delta of 11 concerning for demand ischemia.  Therefore we will get patient admitted for further monitoring.  He was started on Eliquis.  He was given p.o. potassium for mild low potassium.   Don Perking, [place], MD 07/02/19 [phone]

## 2019-07-02 NOTE — ED Notes (Signed)
Patient resting quietly at this time, voices no complaints.  Remains in sinus rhythm.

## 2019-07-02 NOTE — Discharge Summary (Signed)
Physician Discharge Summary  Todd Michael JOA:[id number] DOB: 1975-04-21 DOA: 07/01/2019  PCP: [facility]  Admit date: 07/01/2019 Discharge date: 07/02/2019  Admitted From: home  Disposition:  home  Recommendations for Outpatient Follow-up:  Follow up with PCP in 1 week Home Health: no  Equipment/Devices: n/a  Discharge Condition: stable CODE STATUS: full  Diet recommendation: Regular  Brief/Interim Summary: HPI was taken from Dr. Para March: Todd Michael is a 44 y.o. male with medical history significant for paroxysmal A. fib diagnosed in 2015 thought to be a lone episode related to binge drinking, at the time discharged on aspirin which he stopped taking a long time ago who presents to the emergency room with palpitations.  Patient says that over the past 6 years he felt like he has had rapid A. fib few times but it lasts just a couple seconds and he could aborted with a forceful cough.  This time however he felt like he went into rapid A. fib and 2-1/2 hours later he still had palpitations so decided to come to the emergency room.  States he drinks heavily on the weekend and his last drink was 2 days ago. ED Course: Heart rate on arrival was 146 on EKG and was aborted with a single diltiazem bolus.  Labs significant for creatinine of 1.27 and potassium 3.3.  TSH was elevated at 7.08.  Troponin trended up 48>>59.  Hospitalist consulted for observation  Hospital Course from Dr. Shela Commons. Mayford Knife 07/02/19: Pt was found to be a. Fib and then converted to NSR w/ diltiazem x 1. CHADS VASc score is low and cardio only recommended aspirin daily. No further cardiac work-up was necessary as per cardio. Of note, pt received alcohol cessation counseling. Pt has no interest in cutting down or stopping drinking alcohol.   Discharge Diagnoses:  Active Problems:   Rapid atrial fibrillation (HCC)   Elevated troponin   Alcohol consumption binge drinking  PAF: with RVR. Likely secondary to  binge drinking. Aborted with a single dose of diltiazem. Cardio following and recs apprec  Elevated troponin: likely secondary to demand ischemia.  Patient denies chest pain and has no acute ST-T wave changes  Alcohol abuse: alcohol cessation counseling.    Discharge Instructions  Discharge Instructions    Amb referral to [facility]   Complete by: As directed    Diet general   Complete by: As directed    Discharge instructions   Complete by: As directed    F/u PCP in 1 week   Increase activity slowly   Complete by: As directed      Allergies as of 07/02/2019   No Known Allergies     Medication List    TAKE these medications   aspirin 81 MG EC tablet Take 1 tablet (81 mg total) by mouth daily. Start taking on: Jul 03, 2019   hyoscyamine 0.125 MG tablet Commonly known as: LEVSIN Take 1 tablet (0.125 mg total) by mouth every 4 (four) hours as needed.   metoprolol tartrate 25 MG tablet Commonly known as: LOPRESSOR Take 1 tablet (25 mg total) by mouth daily as needed (palpitations).       No Known Allergies  Consultations:  Cardio, Dr. Juliann Pares   Procedures/Studies: No results found.    Subjective: Pt denies any complaints    Discharge Exam: Vitals:   07/02/19 0900 07/02/19 0936  BP: 132/86 (!) 156/93  Pulse: 61 (!) 57  Resp: 17 18  Temp:  98 F (36.7 C)  SpO2: 100% 100%   Vitals:   07/02/19 0739 07/02/19 0800 07/02/19 0900 07/02/19 0936  BP: 128/88 129/84 132/86 (!) 156/93  Pulse: (!) 59 (!) 54 61 (!) 57  Resp: 11 16 17 18   Temp:    98 F (36.7 C)  TempSrc:    Oral  SpO2: 99% 97% 100% 100%  Weight:      Height:        General: Pt is alert, awake, not in acute distress Cardiovascular S1/S2 +, no rubs, no gallops Respiratory: CTA bilaterally, no wheezing, no rhonchi Abdominal: Soft, NT, ND, bowel sounds + Extremities: no edema, no cyanosis    The results of significant diagnostics from this hospitalization (including imaging,  microbiology, ancillary and laboratory) are listed below for reference.     Microbiology: Recent Results (from the past 240 hour(s))  SARS Coronavirus 2 by RT PCR (hospital order, performed in [facility] hospital lab) Nasopharyngeal Nasopharyngeal Swab     Status: None   Collection Time: 07/02/19  1:50 AM   Specimen: Nasopharyngeal Swab  Result Value Ref Range Status   SARS Coronavirus 2 NEGATIVE NEGATIVE Final    Comment: (NOTE) SARS-CoV-2 target nucleic acids are NOT DETECTED. The SARS-CoV-2 RNA is generally detectable in upper and lower respiratory specimens during the acute phase of infection. The lowest concentration of SARS-CoV-2 viral copies this assay can detect is 250 copies / mL. A negative result does not preclude SARS-CoV-2 infection and should not be used as the sole basis for treatment or other patient management decisions.  A negative result may occur with improper specimen collection / handling, submission of specimen other than nasopharyngeal swab, presence of viral mutation(s) within the areas targeted by this assay, and inadequate number of viral copies (<250 copies / mL). A negative result must be combined with clinical observations, patient history, and epidemiological information. Fact Sheet for Patients:   07/04/19 Fact Sheet for Healthcare Providers: BoilerBrush.com.cy This test is not yet approved or cleared  by the https://pope.com/ FDA and has been authorized for detection and/or diagnosis of SARS-CoV-2 by FDA under an Emergency Use Authorization (EUA).  This EUA will remain in effect (meaning this test can be used) for the duration of the COVID-19 declaration under Section 564(b)(1) of the Act, 21 U.S.C. section 360bbb-3(b)(1), unless the authorization is terminated or revoked sooner. Performed at [facility], [address] [place], [place]      Labs: BNP (last 3  results) No results for input(s): BNP in the last 8760 hours. Basic Metabolic Panel: Recent Labs  Lab 07/01/19 2226 07/02/19 0826  NA 143  --   K 3.3*  --   CL 114*  --   CO2 23  --   GLUCOSE 122*  --   BUN 11  --   CREATININE 1.27*  --   CALCIUM 7.9*  --   MG 2.0 2.5*  PHOS  --  2.1*   Liver Function Tests: Recent Labs  Lab 07/01/19 2226  AST 20  ALT 22  ALKPHOS 58  BILITOT 0.6  PROT 6.3*  ALBUMIN 4.1   No results for input(s): LIPASE, AMYLASE in the last 168 hours. No results for input(s): AMMONIA in the last 168 hours. CBC: Recent Labs  Lab 07/01/19 2226 07/02/19 0826  WBC 5.9 6.2  NEUTROABS 3.2  --   HGB 15.7 16.3  HCT 44.5 47.5  MCV 87.6 87.5  PLT 238 253   Cardiac Enzymes: No results for input(s): CKTOTAL,  CKMB, CKMBINDEX, TROPONINI in the last 168 hours. BNP: Invalid input(s): POCBNP CBG: No results for input(s): GLUCAP in the last 168 hours. D-Dimer No results for input(s): DDIMER in the last 72 hours. Hgb A1c No results for input(s): HGBA1C in the last 72 hours. Lipid Profile No results for input(s): CHOL, HDL, LDLCALC, TRIG, CHOLHDL, LDLDIRECT in the last 72 hours. Thyroid function studies Recent Labs    07/01/19 2226  TSH 7.080*   Anemia work up No results for input(s): VITAMINB12, FOLATE, FERRITIN, TIBC, IRON, RETICCTPCT in the last 72 hours. Urinalysis No results found for: COLORURINE, APPEARANCEUR, [place], [place], [place], [place], [place], [place], PROTEINUR, UROBILINOGEN, NITRITE, LEUKOCYTESUR Sepsis Labs Invalid input(s): PROCALCITONIN,  WBC,  LACTICIDVEN Microbiology Recent Results (from the past 240 hour(s))  SARS Coronavirus 2 by RT PCR (hospital order, performed in [facility] hospital lab) Nasopharyngeal Nasopharyngeal Swab     Status: None   Collection Time: 07/02/19  1:50 AM   Specimen: Nasopharyngeal Swab  Result Value Ref Range Status   SARS Coronavirus 2 NEGATIVE NEGATIVE Final    Comment: (NOTE) SARS-CoV-2  target nucleic acids are NOT DETECTED. The SARS-CoV-2 RNA is generally detectable in upper and lower respiratory specimens during the acute phase of infection. The lowest concentration of SARS-CoV-2 viral copies this assay can detect is 250 copies / mL. A negative result does not preclude SARS-CoV-2 infection and should not be used as the sole basis for treatment or other patient management decisions.  A negative result may occur with improper specimen collection / handling, submission of specimen other than nasopharyngeal swab, presence of viral mutation(s) within the areas targeted by this assay, and inadequate number of viral copies (<250 copies / mL). A negative result must be combined with clinical observations, patient history, and epidemiological information. Fact Sheet for Patients:   StrictlyIdeas.no Fact Sheet for Healthcare Providers: BankingDealers.co.za This test is not yet approved or cleared  by the [place] FDA and has been authorized for detection and/or diagnosis of SARS-CoV-2 by FDA under an Emergency Use Authorization (EUA).  This EUA will remain in effect (meaning this test can be used) for the duration of the COVID-19 declaration under Section 564(b)(1) of the Act, 21 U.S.C. section 360bbb-3(b)(1), unless the authorization is terminated or revoked sooner. Performed at [facility], [address], [place], [place] [address]      Time coordinating discharge: Over 30 minutes  SIGNED:   Wyvonnia Dusky, MD  [facility] 07/02/2019, 12:47 PM Pager   If 7PM-7AM, please contact night-coverage [email or web address]

## 2019-07-02 NOTE — H&P (Signed)
History and Physical    Todd Michael LYY:[id number] DOB: 1975/06/26 DOA: 07/01/2019  PCP: [facility]   Patient coming from: Home  I have personally briefly reviewed patient's old medical records in [facility]  Chief Complaint: Palpitations  HPI: Todd Michael is a 44 y.o. male with medical history significant for paroxysmal A. fib diagnosed in 2015 thought to be a lone episode related to binge drinking, at the time discharged on aspirin which he stopped taking a long time ago who presents to the emergency room with palpitations.  Patient says that over the past 6 years he felt like he has had rapid A. fib few times but it lasts just a couple seconds and he could aborted with a forceful cough.  This time however he felt like he went into rapid A. fib and 2-1/2 hours later he still had palpitations so decided to come to the emergency room.  States he drinks heavily on the weekend and his last drink was 2 days ago. ED Course: Heart rate on arrival was 146 on EKG and was aborted with a single diltiazem bolus.  Labs significant for creatinine of 1.27 and potassium 3.3.  TSH was elevated at 7.08.  Troponin trended up 48>>59.  Hospitalist consulted for observation Review of Systems: As per HPI otherwise 10 point review of systems negative.    Past Medical History:  Diagnosis Date  . Atrial fibrillation [facility])     Past Surgical History:  Procedure Laterality Date  . HERNIA REPAIR       reports that he has quit smoking. He does not have any smokeless tobacco history on file. No history on file for alcohol and drug.  No Known Allergies  No family history on file.   Prior to Admission medications   Medication Sig Start Date End Date Taking? Authorizing Provider  hyoscyamine (LEVSIN, ANASPAZ) 0.125 MG tablet Take 1 tablet (0.125 mg total) by mouth every 4 (four) hours as needed. Patient not taking: Reported on 07/02/2019 11/19/17   Jene Every, MD    Physical  Exam: Vitals:   07/02/19 0030 07/02/19 0100 07/02/19 0130 07/02/19 0200  BP: (!) 127/93 126/90 124/87 (!) 126/95  Pulse: 78 72 66 63  Resp: 18 17 16 15   Temp:      TempSrc:      SpO2: 96% 94% 96% 96%  Weight:      Height:         Vitals:   07/02/19 0030 07/02/19 0100 07/02/19 0130 07/02/19 0200  BP: (!) 127/93 126/90 124/87 (!) 126/95  Pulse: 78 72 66 63  Resp: 18 17 16 15   Temp:      TempSrc:      SpO2: 96% 94% 96% 96%  Weight:      Height:        Constitutional: Alert and awake, oriented x3, not in any acute distress. Eyes: PERLA, EOMI, irises appear normal, anicteric sclera,  ENMT: external ears and nose appear normal, normal hearing             Lips appears normal, oropharynx mucosa, tongue, posterior pharynx appear normal  Neck: neck appears normal, no masses, normal ROM, no thyromegaly, no JVD  CVS: S1-S2 NSR, no murmur rubs or gallops,  , no carotid bruits, pedal pulses palpable, No LE edema Respiratory:  clear to auscultation bilaterally, no wheezing, rales or rhonchi. Respiratory effort normal. No accessory muscle use.  Abdomen: soft nontender, nondistended, normal bowel sounds, no hepatosplenomegaly,  no hernias Musculoskeletal: : no cyanosis, clubbing , no contractures or atrophy Neuro: Cranial nerves II-XII intact, sensation, reflexes normal, strength Psych: judgement and insight appear normal, stable mood and affect,  Skin: no rashes or lesions or ulcers, no induration or nodules   Labs on Admission: I have personally reviewed following labs and imaging studies  CBC: Recent Labs  Lab 07/01/19 2226  WBC 5.9  NEUTROABS 3.2  HGB 15.7  HCT 44.5  MCV 87.6  PLT 768   Basic Metabolic Panel: Recent Labs  Lab 07/01/19 2226  NA 143  K 3.3*  CL 114*  CO2 23  GLUCOSE 122*  BUN 11  CREATININE 1.27*  CALCIUM 7.9*  MG 2.0   GFR: Estimated Creatinine Clearance: 74.2 mL/min (A) (by C-G formula based on SCr of 1.27 mg/dL (H)). Liver Function  Tests: Recent Labs  Lab 07/01/19 2226  AST 20  ALT 22  ALKPHOS 58  BILITOT 0.6  PROT 6.3*  ALBUMIN 4.1   No results for input(s): LIPASE, AMYLASE in the last 168 hours. No results for input(s): AMMONIA in the last 168 hours. Coagulation Profile: Recent Labs  Lab 07/01/19 2226  INR 1.0   Cardiac Enzymes: No results for input(s): CKTOTAL, CKMB, CKMBINDEX, TROPONINI in the last 168 hours. BNP (last 3 results) No results for input(s): PROBNP in the last 8760 hours. HbA1C: No results for input(s): HGBA1C in the last 72 hours. CBG: No results for input(s): GLUCAP in the last 168 hours. Lipid Profile: No results for input(s): CHOL, HDL, LDLCALC, TRIG, CHOLHDL, LDLDIRECT in the last 72 hours. Thyroid Function Tests: Recent Labs    07/01/19 2226  TSH 7.080*  FREET4 0.81   Anemia Panel: No results for input(s): VITAMINB12, FOLATE, FERRITIN, TIBC, IRON, RETICCTPCT in the last 72 hours. Urine analysis: No results found for: COLORURINE, APPEARANCEUR, LABSPEC, PHURINE, GLUCOSEU, HGBUR, BILIRUBINUR, KETONESUR, PROTEINUR, UROBILINOGEN, NITRITE, LEUKOCYTESUR  Radiological Exams on Admission: No results found.  EKG: Independently reviewed.   Assessment/Plan Active Problems: Paroxysmal atrial fibrillation with RVR -Likely secondary to binge drinking -Aborted with a single dose of diltiazem -Monitor overnight -Cardiology consult for recommendations    Elevated troponin -Troponin trended up from 48-59 -Suspect related to demand ischemia.  Patient denies chest pain and has no acute ST-T wave changes    Alcohol consumption binge drinking -Counseled on cutting back and on abstinence  DVT prophylaxis: Lovenox  Code Status: full code  Family Communication:  none  Disposition Plan: Back to previous home environment Consults called: none  Status:obs    Athena Masse MD [facility]     07/02/2019, 2:49 AM

## 2019-07-02 NOTE — ED Notes (Signed)
Patient has converted to normal sinus rhythm.  MD aware.

## 2019-08-23 IMAGING — CR DG CHEST 2V
1 series · 2 of 2 positions shown · non-contrast
Comparison: Chest x-ray dated July 25, 2013.

CLINICAL DATA: Chest pain.

EXAM:
CHEST - 2 VIEW

[Series 1: dg chest 2 view · 0.14mm/px · 2 of 2 slices shown]
[im 1/2]
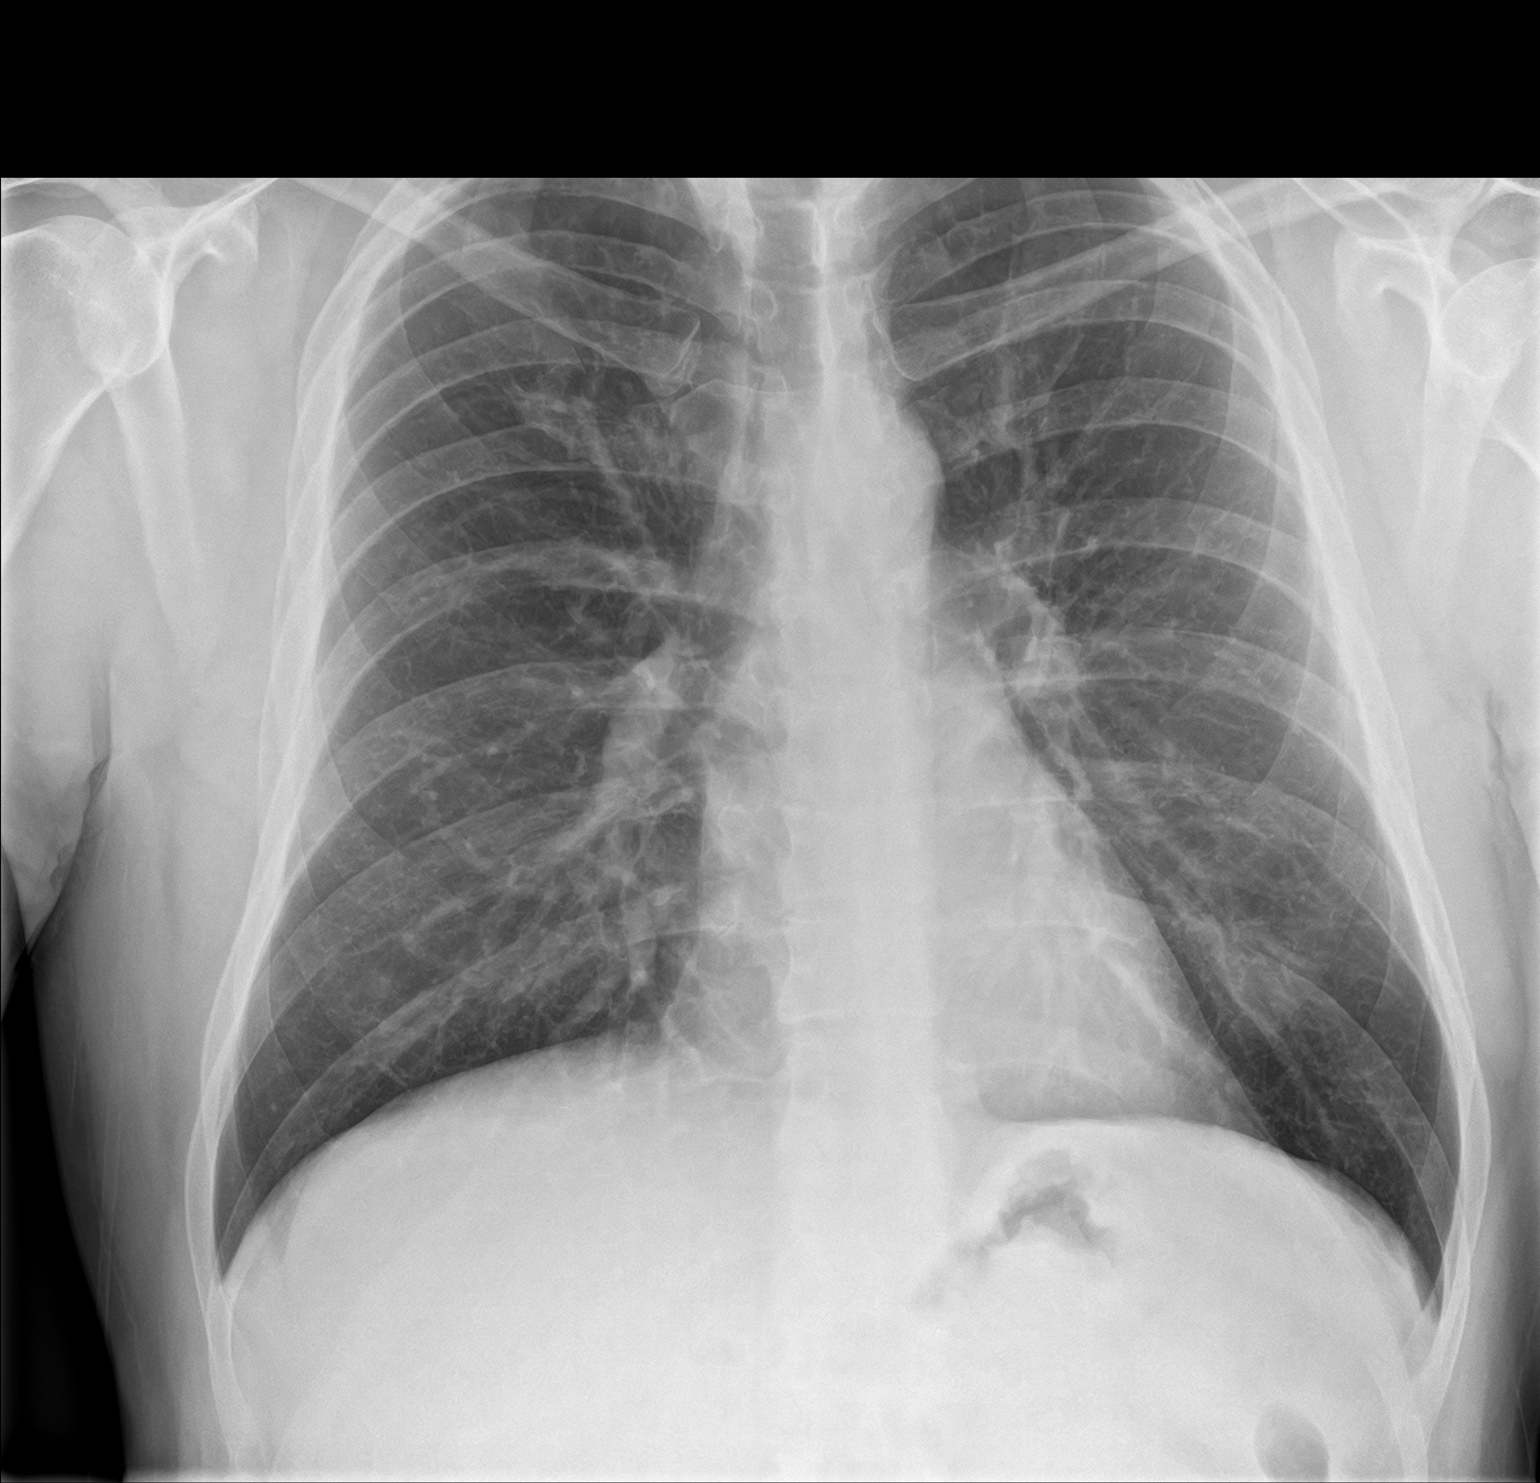
[im 2/2]
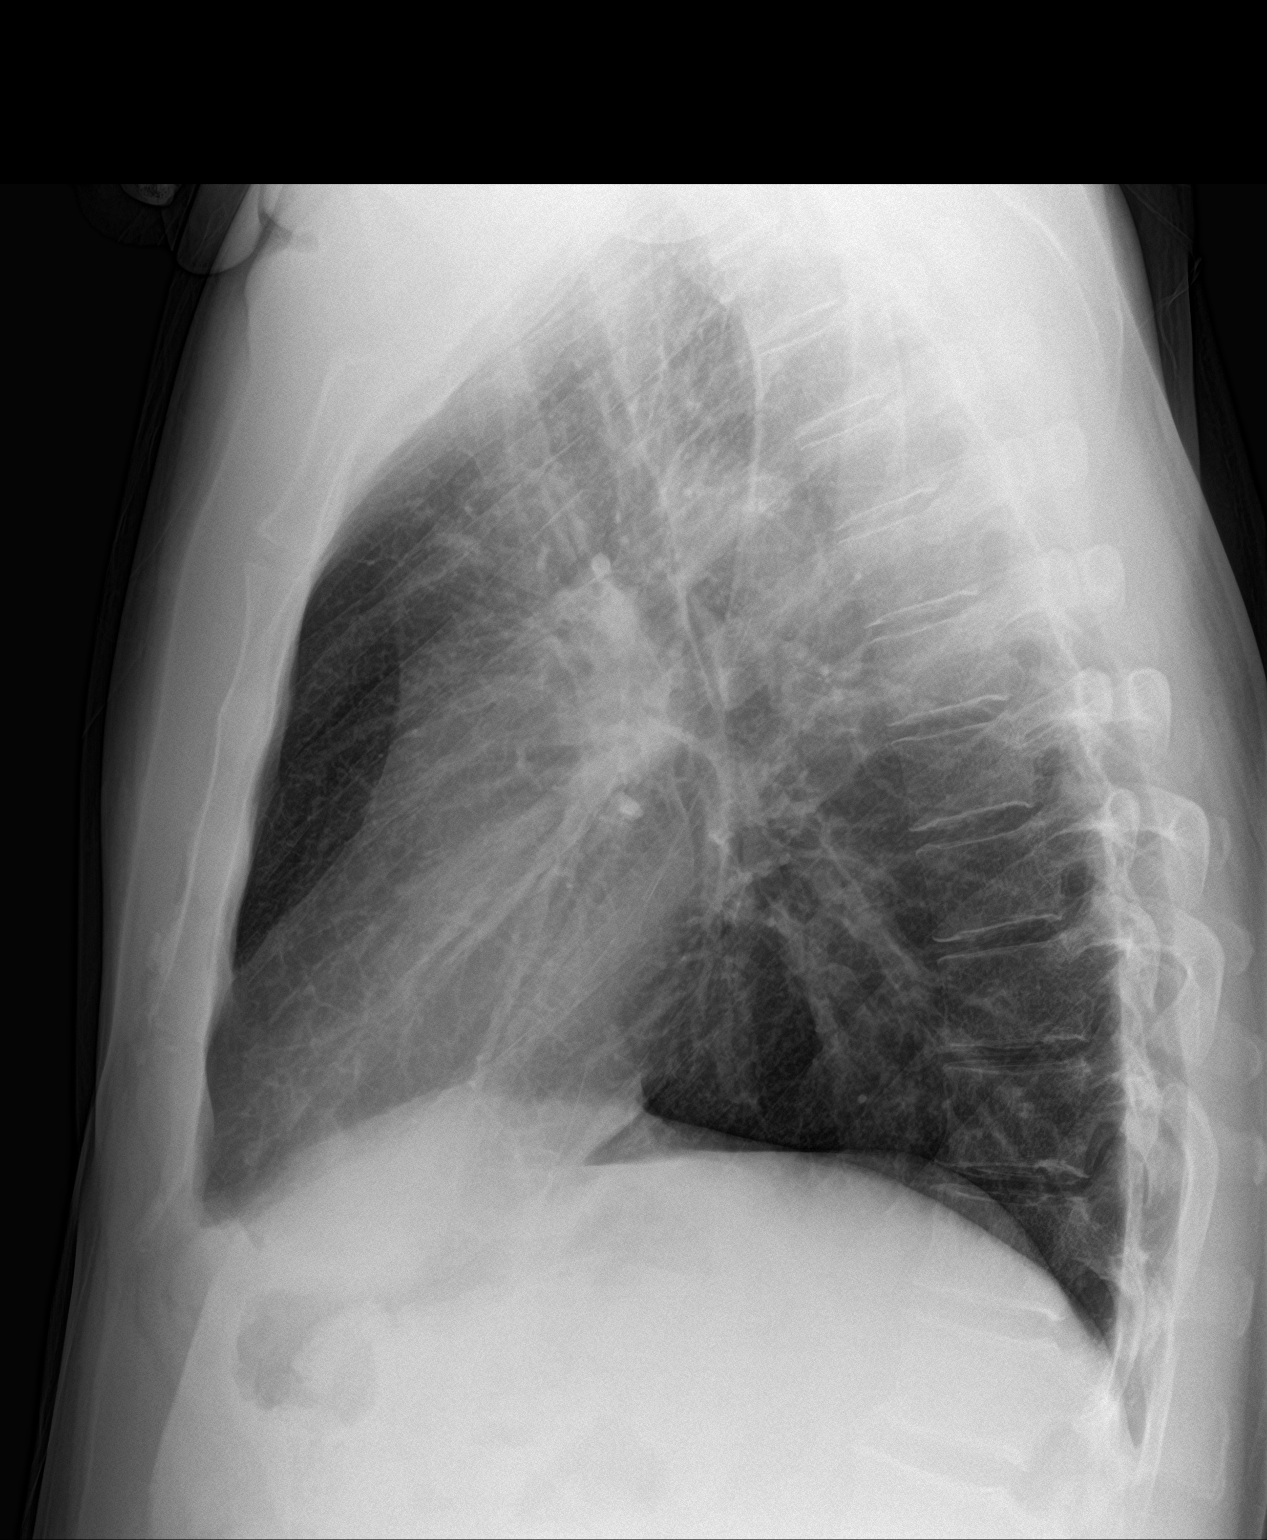

[2 of 2 positions shown; findings below may reference images not displayed]

FINDINGS: The heart size and mediastinal contours are within normal limits.
Both lungs are clear. The visualized skeletal structures are
unremarkable.
IMPRESSION: No active cardiopulmonary disease.
